# Patient Record
Sex: Male | Born: 1960 | Race: Black or African American | Hispanic: No | Marital: Married | Smoking: Current every day smoker
Health system: Southern US, Community
[De-identification: ages and names within clinical notes are randomized; demographics above are authoritative.]

## PROBLEM LIST (undated history)

## (undated) DIAGNOSIS — S31139A Puncture wound of abdominal wall without foreign body, unspecified quadrant without penetration into peritoneal cavity, initial encounter: Secondary | ICD-10-CM

## (undated) DIAGNOSIS — K219 Gastro-esophageal reflux disease without esophagitis: Secondary | ICD-10-CM

## (undated) DIAGNOSIS — W3400XA Accidental discharge from unspecified firearms or gun, initial encounter: Secondary | ICD-10-CM

## (undated) HISTORY — PX: MULTIPLE TOOTH EXTRACTIONS: SHX2053

## (undated) HISTORY — PX: WOUND CLOSURE SECONDARY ABDOMEN: SUR1444

## (undated) HISTORY — DX: Gastro-esophageal reflux disease without esophagitis: K21.9

---

## 2002-05-23 ENCOUNTER — Emergency Department (HOSPITAL_COMMUNITY): Admission: EM | Admit: 2002-05-23 | Discharge: 2002-05-23 | Payer: Self-pay

## 2010-10-13 ENCOUNTER — Encounter (INDEPENDENT_AMBULATORY_CARE_PROVIDER_SITE_OTHER): Payer: Self-pay | Admitting: *Deleted

## 2011-01-05 NOTE — Letter (Signed)
Summary: [facility] Referral (unable to schedule) Notification  [facility]  [address] [place], [place] [address]   Phone: [phone]  Fax: [phone]      October 13, 2010 Jeremy Parker Apr 09, 1961 MRN: [id number]   [facility] [address] [place], [place]  [address]   Dear Dr. Ricki Miller:   Thank you for your kind referral of the above patient. We have attempted to schedule the recommended COLONOSCOPY but have been unable to schedule because:  _X_ The patient was not available by phone and/or has not returned our calls.  __ The patient declined to schedule the procedure at this time.  We appreciate the referral and hope that we will have the opportunity to treat this patient in the future.    Sincerely,   [facility]  Vania Rea. Jarold Motto M.D. Hedwig Morton. Juanda Chance M.D. Venita Lick. Russella Dar M.D. Wilhemina Bonito. Marina Goodell M.D. Barbette Hair. Arlyce Dice M.D. Iva Boop M.D. Cheron Every.D.

## 2012-04-08 ENCOUNTER — Ambulatory Visit (INDEPENDENT_AMBULATORY_CARE_PROVIDER_SITE_OTHER): Payer: 59 | Admitting: Family Medicine

## 2012-04-08 ENCOUNTER — Ambulatory Visit: Payer: 59

## 2012-04-08 VITALS — BP 147/87 | HR 79 | Temp 98.6°F | Resp 16 | Ht 69.5 in | Wt 188.0 lb

## 2012-04-08 DIAGNOSIS — R0781 Pleurodynia: Secondary | ICD-10-CM

## 2012-04-08 DIAGNOSIS — S2239XA Fracture of one rib, unspecified side, initial encounter for closed fracture: Secondary | ICD-10-CM

## 2012-04-08 DIAGNOSIS — R079 Chest pain, unspecified: Secondary | ICD-10-CM

## 2012-04-08 MED ORDER — CYCLOBENZAPRINE HCL 5 MG PO TABS: 5.0000 mg | ORAL_TABLET | Freq: Three times a day (TID) | ORAL | Status: AC | PRN

## 2012-04-08 MED ORDER — KETOROLAC TROMETHAMINE 60 MG/2ML IM SOLN
60.0000 mg | Freq: Once | INTRAMUSCULAR | Status: AC
  Administered 2012-04-08: 60 mg via INTRAMUSCULAR

## 2012-04-08 MED ORDER — HYDROCODONE-ACETAMINOPHEN 5-500 MG PO TABS: 1.0000 | ORAL_TABLET | Freq: Three times a day (TID) | ORAL | Status: AC | PRN
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
## 2012-05-18 ENCOUNTER — Ambulatory Visit (INDEPENDENT_AMBULATORY_CARE_PROVIDER_SITE_OTHER): Payer: 59 | Admitting: Emergency Medicine

## 2012-05-18 VITALS — BP 117/73 | HR 60 | Temp 98.0°F | Resp 16 | Ht 69.5 in | Wt 186.6 lb

## 2012-05-18 DIAGNOSIS — S2239XA Fracture of one rib, unspecified side, initial encounter for closed fracture: Secondary | ICD-10-CM

## 2012-05-18 DIAGNOSIS — R079 Chest pain, unspecified: Secondary | ICD-10-CM

## 2012-05-18 NOTE — Progress Notes (Signed)
  Subjective:    Patient ID: Jeremy Parker, male    DOB: 07/19/1961, 51 y.o.   MRN: [id number]  Chest Pain  This is a new problem. The current episode started 1 to 4 weeks ago. The onset quality is sudden. The problem occurs constantly. The problem has been gradually improving. The pain is present in the lateral region. The pain is mild. The quality of the pain is described as sharp. The pain does not radiate. Pertinent negatives include no abdominal pain, back pain, claudication, cough, diaphoresis, dizziness, exertional chest pressure, fever, headaches, hemoptysis, irregular heartbeat, leg pain, lower extremity edema, malaise/fatigue, nausea, near-syncope, numbness, orthopnea, palpitations, PND, shortness of breath, sputum production, syncope, vomiting or weakness. The pain is aggravated by movement and deep breathing. He has tried analgesics for the symptoms.  His past medical history is significant for recent injury.      Review of Systems  Constitutional: Negative for fever, malaise/fatigue and diaphoresis.  HENT: Negative.   Eyes: Negative.   Respiratory: Negative for cough, hemoptysis, sputum production and shortness of breath.   Cardiovascular: Positive for chest pain. Negative for palpitations, orthopnea, claudication, syncope, PND and near-syncope.  Gastrointestinal: Negative for nausea, vomiting and abdominal pain.  Genitourinary: Negative.   Musculoskeletal: Negative for back pain.  Neurological: Negative for dizziness, weakness, numbness and headaches.       Objective:   Physical Exam  Constitutional: He is oriented to person, place, and time. He appears well-developed and well-nourished.  HENT:  Head: Normocephalic and atraumatic.  Eyes: Conjunctivae and EOM are normal.  Neck: Normal range of motion. Neck supple. No tracheal deviation present.  Cardiovascular: Normal rate, regular rhythm and normal heart sounds.   Pulmonary/Chest: Effort normal and breath sounds normal.  He exhibits tenderness.  Abdominal: Soft. Bowel sounds are normal.  Musculoskeletal: Normal range of motion.  Neurological: He is alert and oriented to person, place, and time.  Skin: Skin is warm and dry.          Assessment & Plan:  Doing well with history of fractured ribs.  No complaints other than moderate pain with exertion.  Tolerating work well. Follow up as needed

## 2012-11-14 ENCOUNTER — Emergency Department (HOSPITAL_COMMUNITY)
Admission: EM | Admit: 2012-11-14 | Discharge: 2012-11-14 | Disposition: A | Discharge status: Home / Self Care | Payer: No Typology Code available for payment source | Attending: Emergency Medicine | Admitting: Emergency Medicine

## 2012-11-14 ENCOUNTER — Emergency Department (HOSPITAL_COMMUNITY): Payer: No Typology Code available for payment source

## 2012-11-14 ENCOUNTER — Encounter (HOSPITAL_COMMUNITY): Payer: Self-pay | Admitting: Emergency Medicine

## 2012-11-14 DIAGNOSIS — R51 Headache: Secondary | ICD-10-CM | POA: Insufficient documentation

## 2012-11-14 DIAGNOSIS — IMO0002 Reserved for concepts with insufficient information to code with codable children: Secondary | ICD-10-CM | POA: Insufficient documentation

## 2012-11-14 DIAGNOSIS — S060X9A Concussion with loss of consciousness of unspecified duration, initial encounter: Secondary | ICD-10-CM

## 2012-11-14 DIAGNOSIS — Y939 Activity, unspecified: Secondary | ICD-10-CM | POA: Insufficient documentation

## 2012-11-14 DIAGNOSIS — H53149 Visual discomfort, unspecified: Secondary | ICD-10-CM | POA: Insufficient documentation

## 2012-11-14 DIAGNOSIS — F17200 Nicotine dependence, unspecified, uncomplicated: Secondary | ICD-10-CM | POA: Insufficient documentation

## 2012-11-14 DIAGNOSIS — M549 Dorsalgia, unspecified: Secondary | ICD-10-CM

## 2012-11-14 DIAGNOSIS — S060XAA Concussion with loss of consciousness status unknown, initial encounter: Secondary | ICD-10-CM | POA: Insufficient documentation

## 2012-11-14 HISTORY — DX: Accidental discharge from unspecified firearms or gun, initial encounter: W34.00XA

## 2012-11-14 HISTORY — DX: Puncture wound of abdominal wall without foreign body, unspecified quadrant without penetration into peritoneal cavity, initial encounter: S31.139A

## 2012-11-14 MED ORDER — METHOCARBAMOL 500 MG PO TABS: 1000.0000 mg | ORAL_TABLET | Freq: Four times a day (QID) | ORAL | Status: DC

## 2012-11-14 MED ORDER — IBUPROFEN 600 MG PO TABS: 600.0000 mg | ORAL_TABLET | Freq: Four times a day (QID) | ORAL | Status: DC | PRN

## 2012-11-14 MED ORDER — HYDROCODONE-ACETAMINOPHEN 5-325 MG PO TABS: ORAL_TABLET | ORAL | Status: DC

## 2012-11-14 MED ORDER — OXYCODONE-ACETAMINOPHEN 5-325 MG PO TABS
1.0000 | ORAL_TABLET | Freq: Once | ORAL | Status: AC
  Administered 2012-11-14: 1 via ORAL
  Filled 2012-11-14: qty 1
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
## 2012-11-14 NOTE — ED Notes (Signed)
Patient transported to X-ray 

## 2012-11-14 NOTE — ED Notes (Signed)
Patient transported to CT 

## 2012-11-16 NOTE — ED Provider Notes (Signed)
Medical screening examination/treatment/procedure(s) were performed by non-physician practitioner and as supervising physician I was immediately available for consultation/collaboration.  Deloras Reichard, MD 11/16/12 1409 

## 2013-06-20 ENCOUNTER — Encounter: Payer: Self-pay | Admitting: Gastroenterology

## 2013-08-07 ENCOUNTER — Ambulatory Visit (INDEPENDENT_AMBULATORY_CARE_PROVIDER_SITE_OTHER): Payer: 59 | Admitting: Family Medicine

## 2013-08-07 VITALS — BP 120/70 | HR 88 | Temp 99.3°F | Resp 16 | Ht 69.5 in | Wt 185.8 lb

## 2013-08-07 DIAGNOSIS — J209 Acute bronchitis, unspecified: Secondary | ICD-10-CM

## 2013-08-07 MED ORDER — BENZONATATE 100 MG PO CAPS: 100.0000 mg | ORAL_CAPSULE | Freq: Three times a day (TID) | ORAL | Status: DC | PRN

## 2013-08-07 MED ORDER — AZITHROMYCIN 250 MG PO TABS: ORAL_TABLET | ORAL | Status: DC

## 2013-08-07 MED ORDER — ALBUTEROL SULFATE HFA 108 (90 BASE) MCG/ACT IN AERS: 2.0000 | INHALATION_SPRAY | Freq: Four times a day (QID) | RESPIRATORY_TRACT | Status: AC | PRN

## 2013-08-07 MED ORDER — HYDROCODONE-HOMATROPINE 5-1.5 MG/5ML PO SYRP: 5.0000 mL | ORAL_SOLUTION | Freq: Three times a day (TID) | ORAL | Status: DC | PRN

## 2013-08-07 NOTE — Patient Instructions (Addendum)
Drink plenty of fluids.  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   You can also use the tessalon perles as needed- these are non- drowsy
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
## 2013-08-22 ENCOUNTER — Ambulatory Visit (AMBULATORY_SURGERY_CENTER): Payer: Self-pay | Admitting: *Deleted

## 2013-08-22 VITALS — Ht 71.0 in | Wt 185.0 lb

## 2013-08-22 DIAGNOSIS — Z1211 Encounter for screening for malignant neoplasm of colon: Secondary | ICD-10-CM

## 2013-08-22 MED ORDER — MOVIPREP 100 G PO SOLR: ORAL | Status: DC

## 2013-08-22 NOTE — Progress Notes (Signed)
Patient denies any allergies to eggs or soy. Patient denies any problems with anesthesia.  

## 2013-08-24 ENCOUNTER — Encounter: Payer: Self-pay | Admitting: Gastroenterology
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
## 2013-09-05 ENCOUNTER — Encounter: Payer: Self-pay | Admitting: Gastroenterology

## 2013-09-12 ENCOUNTER — Encounter: Payer: Self-pay | Admitting: Gastroenterology

## 2013-09-12 ENCOUNTER — Ambulatory Visit (AMBULATORY_SURGERY_CENTER): Payer: 59 | Admitting: Gastroenterology

## 2013-09-12 VITALS — BP 134/80 | HR 61 | Temp 97.3°F | Resp 18 | Ht 71.0 in | Wt 185.0 lb

## 2013-09-12 DIAGNOSIS — D126 Benign neoplasm of colon, unspecified: Secondary | ICD-10-CM

## 2013-09-12 DIAGNOSIS — Z1211 Encounter for screening for malignant neoplasm of colon: Secondary | ICD-10-CM

## 2013-09-12 MED ORDER — SODIUM CHLORIDE 0.9 % IV SOLN: 500.0000 mL | INTRAVENOUS | Status: DC

## 2013-09-12 NOTE — Op Note (Signed)
[facility] [address] [place], [address]   COLONOSCOPY PROCEDURE REPORT  PATIENT: Jeremy, Parker  MR#: [id number] BIRTHDATE: Apr 21, 1961 , 52  yrs. old GENDER: Male ENDOSCOPIST: Rachael Fee, MD REFERRED [id number] Ricki Miller, M.D. PROCEDURE DATE:  09/12/2013 PROCEDURE:   Colonoscopy with biopsy First Screening Colonoscopy - Avg.  risk and is 50 yrs.  old or older Yes.  Prior Negative Screening - Now for repeat screening. N/A  History of Adenoma - Now for follow-up colonoscopy & has been > or = to 3 yrs.  N/A  Polyps Removed Today? Yes. ASA CLASS:   Class II INDICATIONS:average risk screening. MEDICATIONS: Fentanyl 50 mcg IV, Versed 4 mg IV, and These medications were titrated to patient response per physician's verbal order  DESCRIPTION OF PROCEDURE:   After the risks benefits and alternatives of the procedure were thoroughly explained, informed consent was obtained.  A digital rectal exam revealed no abnormalities of the rectum.   The [id number]  endoscope was introduced through the anus and advanced to the cecum, which was identified by both the appendix and ileocecal valve. No adverse events experienced.   The quality of the prep was good.  The instrument was then slowly withdrawn as the colon was fully examined.   COLON FINDINGS: One small polyp was removed but not retrieved.  This was 1-3mm across, sessile, located in descending segment, removed with biopsy forceps.  The examination was otherwise normal. Retroflexed views revealed no abnormalities. The time to cecum=2 minutes 40 seconds.  Withdrawal time=13 minutes 17 seconds.  The scope was withdrawn and the procedure completed. COMPLICATIONS: There were no complications.  ENDOSCOPIC IMPRESSION: One small polyp was removed but not retrieved. The examination was otherwise normal.  RECOMMENDATIONS: You should continue to follow colorectal cancer screening guidelines for "routine risk"  patients with a repeat colonoscopy in 10 years. There is no need for FOBT (stool) testing for at least 5 years.   eSigned:  Rachael Fee, MD 09/12/2013 9:39 AM

## 2013-09-12 NOTE — Patient Instructions (Addendum)
One of your biggest health concerns is your smoking.  This increases your risk for most cancers and serious cardiovascular diseases such as strokes, heart attacks.  You should try your best to stop.  If you need assistance, please contact your PCP or Smoking Cessation Class at [facility] [phone]) or [facility] (1-800-QUIT-NOW).   YOU HAD AN ENDOSCOPIC PROCEDURE TODAY AT [facility]: Refer to the procedure report that was given to you for any specific questions about what was found during the examination.  If the procedure report does not answer your questions, please call your gastroenterologist to clarify.  If you requested that your care partner not be given the details of your procedure findings, then the procedure report has been included in a sealed envelope for you to review at your convenience later.  YOU SHOULD EXPECT: Some feelings of bloating in the abdomen. Passage of more gas than usual.  Walking can help get rid of the air that was put into your GI tract during the procedure and reduce the bloating. If you had a lower endoscopy (such as a colonoscopy or flexible sigmoidoscopy) you Mealey notice spotting of blood in your stool or on the toilet paper. If you underwent a bowel prep for your procedure, then you Mcgovern not have a normal bowel movement for a few days.  DIET: Your first meal following the procedure should be a light meal and then it is ok to progress to your normal diet.  A half-sandwich or bowl of soup is an example of a good first meal.  Heavy or fried foods are harder to digest and [place] make you feel nauseous or bloated.  Likewise meals heavy in dairy and vegetables can cause extra gas to form and this can also increase the bloating.  Drink plenty of fluids but you should avoid alcoholic beverages for 24 hours.  ACTIVITY: Your care partner should take you home directly after the procedure.  You should plan to take it easy, moving slowly for the rest of  the day.  You can resume normal activity the day after the procedure however you should NOT DRIVE or use heavy machinery for 24 hours (because of the sedation medicines used during the test).    SYMPTOMS TO REPORT IMMEDIATELY: A gastroenterologist can be reached at any hour.  During normal business hours, 8:30 AM to 5:00 PM Monday through Friday, call [phone].  After hours and on weekends, please call the GI answering service at [phone] who will take a message and have the physician on call contact you.   Following lower endoscopy (colonoscopy or flexible sigmoidoscopy):  Excessive amounts of blood in the stool  Significant tenderness or worsening of abdominal pains  Swelling of the abdomen that is new, acute  Fever of 100F or higher  FOLLOW UP: If any biopsies were taken you will be contacted by phone or by letter within the next 1-3 weeks.  Call your gastroenterologist if you have not heard about the biopsies in 3 weeks.  Our staff will call the home number listed on your records the next business day following your procedure to check on you and address any questions or concerns that you Mccollom have at that time regarding the information given to you following your procedure. This is a courtesy call and so if there is no answer at the home number and we have not heard from you through the emergency physician on call, we will assume that you have returned  to your regular daily activities without incident.  SIGNATURES/CONFIDENTIALITY: You and/or your care partner have signed paperwork which will be entered into your electronic medical record.  These signatures attest to the fact that that the information above on your After Visit Summary has been reviewed and is understood.  Full responsibility of the confidentiality of this discharge information lies with you and/or your care-partner.  Resume medications. Information given on polyps with discharge instructions.

## 2013-09-12 NOTE — Progress Notes (Signed)
The pt tolerated the colonoscopy very well. Maw   

## 2013-09-12 NOTE — Progress Notes (Signed)
No egg or soy allergy. ewm No problems with past sedation. ewm 
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
## 2013-09-13 ENCOUNTER — Telehealth: Payer: Self-pay

## 2013-09-13 NOTE — Telephone Encounter (Signed)
The mail box for [phone] is full and can not except any message at this time was the recording on the answering machine.  Unable to leave a message. Maw

## 2014-03-15 ENCOUNTER — Emergency Department (HOSPITAL_COMMUNITY): Payer: 59

## 2014-03-15 ENCOUNTER — Emergency Department (HOSPITAL_COMMUNITY)
Admission: EM | Admit: 2014-03-15 | Discharge: 2014-03-15 | Disposition: A | Discharge status: Home / Self Care | Payer: 59 | Attending: Emergency Medicine | Admitting: Emergency Medicine

## 2014-03-15 ENCOUNTER — Encounter (HOSPITAL_COMMUNITY): Payer: Self-pay | Admitting: Emergency Medicine

## 2014-03-15 DIAGNOSIS — S298XXA Other specified injuries of thorax, initial encounter: Secondary | ICD-10-CM | POA: Insufficient documentation

## 2014-03-15 DIAGNOSIS — F17200 Nicotine dependence, unspecified, uncomplicated: Secondary | ICD-10-CM | POA: Insufficient documentation

## 2014-03-15 DIAGNOSIS — S299XXA Unspecified injury of thorax, initial encounter: Secondary | ICD-10-CM

## 2014-03-15 DIAGNOSIS — S060XAA Concussion with loss of consciousness status unknown, initial encounter: Secondary | ICD-10-CM

## 2014-03-15 DIAGNOSIS — S0083XA Contusion of other part of head, initial encounter: Secondary | ICD-10-CM

## 2014-03-15 DIAGNOSIS — K219 Gastro-esophageal reflux disease without esophagitis: Secondary | ICD-10-CM | POA: Insufficient documentation

## 2014-03-15 DIAGNOSIS — Y92410 Unspecified street and highway as the place of occurrence of the external cause: Secondary | ICD-10-CM | POA: Insufficient documentation

## 2014-03-15 DIAGNOSIS — Y9389 Activity, other specified: Secondary | ICD-10-CM | POA: Insufficient documentation

## 2014-03-15 DIAGNOSIS — Z79899 Other long term (current) drug therapy: Secondary | ICD-10-CM | POA: Insufficient documentation

## 2014-03-15 DIAGNOSIS — S060X9A Concussion with loss of consciousness of unspecified duration, initial encounter: Secondary | ICD-10-CM

## 2014-03-15 DIAGNOSIS — S1093XA Contusion of unspecified part of neck, initial encounter: Secondary | ICD-10-CM

## 2014-03-15 DIAGNOSIS — S0003XA Contusion of scalp, initial encounter: Secondary | ICD-10-CM | POA: Insufficient documentation

## 2014-03-15 DIAGNOSIS — IMO0002 Reserved for concepts with insufficient information to code with codable children: Secondary | ICD-10-CM | POA: Insufficient documentation

## 2014-03-15 DIAGNOSIS — S060X0A Concussion without loss of consciousness, initial encounter: Secondary | ICD-10-CM | POA: Insufficient documentation

## 2014-03-15 MED ORDER — HYDROCODONE-ACETAMINOPHEN 5-325 MG PO TABS: 1.0000 | ORAL_TABLET | ORAL | Status: AC | PRN

## 2014-03-15 MED ORDER — ACETAMINOPHEN 500 MG PO TABS
1000.0000 mg | ORAL_TABLET | Freq: Once | ORAL | Status: AC
  Administered 2014-03-15: 1000 mg via ORAL
  Filled 2014-03-15: qty 2
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
## 2014-03-15 NOTE — Discharge Instructions (Signed)

## 2014-03-15 NOTE — ED Notes (Signed)
Transported to CT 
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
## 2017-07-27 DIAGNOSIS — R5383 Other fatigue: Secondary | ICD-10-CM | POA: Diagnosis not present

## 2017-07-27 DIAGNOSIS — Z0000 Encounter for general adult medical examination without abnormal findings: Secondary | ICD-10-CM | POA: Diagnosis not present

## 2017-07-27 DIAGNOSIS — Z125 Encounter for screening for malignant neoplasm of prostate: Secondary | ICD-10-CM | POA: Diagnosis not present

## 2017-08-04 DIAGNOSIS — Z0000 Encounter for general adult medical examination without abnormal findings: Secondary | ICD-10-CM | POA: Diagnosis not present

## 2017-08-04 DIAGNOSIS — E291 Testicular hypofunction: Secondary | ICD-10-CM | POA: Diagnosis not present

## 2017-08-17 DIAGNOSIS — E291 Testicular hypofunction: Secondary | ICD-10-CM | POA: Diagnosis not present

## 2017-09-07 DIAGNOSIS — E291 Testicular hypofunction: Secondary | ICD-10-CM | POA: Diagnosis not present

## 2017-10-05 DIAGNOSIS — Z23 Encounter for immunization: Secondary | ICD-10-CM | POA: Diagnosis not present

## 2017-10-05 DIAGNOSIS — E291 Testicular hypofunction: Secondary | ICD-10-CM | POA: Diagnosis not present

## 2017-10-26 DIAGNOSIS — E291 Testicular hypofunction: Secondary | ICD-10-CM | POA: Diagnosis not present

## 2017-10-26 DIAGNOSIS — Z0000 Encounter for general adult medical examination without abnormal findings: Secondary | ICD-10-CM | POA: Diagnosis not present

## 2017-11-02 DIAGNOSIS — E291 Testicular hypofunction: Secondary | ICD-10-CM | POA: Diagnosis not present

## 2017-11-23 DIAGNOSIS — E291 Testicular hypofunction: Secondary | ICD-10-CM | POA: Diagnosis not present

## 2017-12-14 DIAGNOSIS — E291 Testicular hypofunction: Secondary | ICD-10-CM | POA: Diagnosis not present

## 2018-01-25 DIAGNOSIS — E291 Testicular hypofunction: Secondary | ICD-10-CM | POA: Diagnosis not present

## 2018-02-01 DIAGNOSIS — E291 Testicular hypofunction: Secondary | ICD-10-CM | POA: Diagnosis not present

## 2018-02-08 DIAGNOSIS — E291 Testicular hypofunction: Secondary | ICD-10-CM | POA: Diagnosis not present

## 2018-02-08 DIAGNOSIS — Z0000 Encounter for general adult medical examination without abnormal findings: Secondary | ICD-10-CM | POA: Diagnosis not present

## 2018-02-15 DIAGNOSIS — E291 Testicular hypofunction: Secondary | ICD-10-CM | POA: Diagnosis not present

## 2018-03-08 DIAGNOSIS — E291 Testicular hypofunction: Secondary | ICD-10-CM | POA: Diagnosis not present

## 2018-03-29 DIAGNOSIS — E291 Testicular hypofunction: Secondary | ICD-10-CM | POA: Diagnosis not present

## 2018-05-17 DIAGNOSIS — E291 Testicular hypofunction: Secondary | ICD-10-CM | POA: Diagnosis not present

## 2019-06-27 ENCOUNTER — Other Ambulatory Visit: Payer: Self-pay | Admitting: Family Medicine

## 2019-11-12 ENCOUNTER — Other Ambulatory Visit: Payer: Self-pay

## 2019-11-12 DIAGNOSIS — Z20822 Contact with and (suspected) exposure to covid-19: Secondary | ICD-10-CM

## 2019-11-13 LAB — NOVEL CORONAVIRUS, NAA: SARS-CoV-2, NAA: NOT DETECTED

## 2023-05-24 ENCOUNTER — Other Ambulatory Visit: Payer: Self-pay | Admitting: Family Medicine

## 2023-05-24 DIAGNOSIS — M5410 Radiculopathy, site unspecified: Secondary | ICD-10-CM

## 2023-05-24 DIAGNOSIS — M479 Spondylosis, unspecified: Secondary | ICD-10-CM

## 2023-05-24 DIAGNOSIS — M549 Dorsalgia, unspecified: Secondary | ICD-10-CM

## 2023-06-17 ENCOUNTER — Encounter: Payer: Self-pay | Admitting: Family Medicine

## 2023-06-19 ENCOUNTER — Ambulatory Visit
Admission: RE | Admit: 2023-06-19 | Discharge: 2023-06-19 | Disposition: A | Discharge status: Home / Self Care | Payer: Self-pay | Source: Ambulatory Visit | Attending: Family Medicine | Admitting: Family Medicine

## 2023-06-19 DIAGNOSIS — M549 Dorsalgia, unspecified: Secondary | ICD-10-CM

## 2023-06-19 DIAGNOSIS — M479 Spondylosis, unspecified: Secondary | ICD-10-CM

## 2023-06-19 DIAGNOSIS — M5410 Radiculopathy, site unspecified: Secondary | ICD-10-CM
